# Patient Record
Sex: Female | Born: 1970 | Race: Black or African American | Hispanic: No | Marital: Married | Smoking: Current every day smoker
Health system: Southern US, Community
[De-identification: ages and names within clinical notes are randomized; demographics above are authoritative.]

## PROBLEM LIST (undated history)

## (undated) DIAGNOSIS — N61 Mastitis without abscess: Secondary | ICD-10-CM

## (undated) HISTORY — PX: TUBAL LIGATION: SHX77

---

## 2005-06-30 ENCOUNTER — Emergency Department (HOSPITAL_COMMUNITY): Admission: EM | Admit: 2005-06-30 | Discharge: 2005-06-30 | Payer: Self-pay | Admitting: Emergency Medicine

## 2006-03-05 ENCOUNTER — Emergency Department (HOSPITAL_COMMUNITY): Admission: EM | Admit: 2006-03-05 | Discharge: 2006-03-05 | Payer: Self-pay | Admitting: Emergency Medicine

## 2009-10-04 ENCOUNTER — Emergency Department (HOSPITAL_COMMUNITY): Admission: EM | Admit: 2009-10-04 | Discharge: 2009-10-04 | Payer: Self-pay | Admitting: Emergency Medicine

## 2009-12-26 ENCOUNTER — Emergency Department (HOSPITAL_COMMUNITY): Admission: EM | Admit: 2009-12-26 | Discharge: 2009-12-26 | Payer: Self-pay | Admitting: Emergency Medicine

## 2010-05-24 ENCOUNTER — Emergency Department (HOSPITAL_COMMUNITY)
Admission: EM | Admit: 2010-05-24 | Discharge: 2010-05-24 | Payer: Self-pay | Source: Home / Self Care | Admitting: Emergency Medicine

## 2010-11-20 ENCOUNTER — Emergency Department (HOSPITAL_COMMUNITY)
Admission: EM | Admit: 2010-11-20 | Discharge: 2010-11-20 | Disposition: A | Discharge status: Home / Self Care | Payer: Medicaid Other | Attending: Emergency Medicine | Admitting: Emergency Medicine

## 2010-11-20 ENCOUNTER — Emergency Department (HOSPITAL_COMMUNITY): Admission: EM | Admit: 2010-11-20 | Payer: Self-pay | Source: Home / Self Care

## 2010-11-20 DIAGNOSIS — N61 Mastitis without abscess: Secondary | ICD-10-CM | POA: Insufficient documentation

## 2010-11-20 DIAGNOSIS — N644 Mastodynia: Secondary | ICD-10-CM | POA: Insufficient documentation

## 2011-06-08 ENCOUNTER — Emergency Department (HOSPITAL_COMMUNITY)
Admission: EM | Admit: 2011-06-08 | Discharge: 2011-06-08 | Disposition: A | Discharge status: Home / Self Care | Payer: Medicaid Other | Attending: Emergency Medicine | Admitting: Emergency Medicine

## 2011-06-08 ENCOUNTER — Encounter (HOSPITAL_COMMUNITY): Payer: Self-pay | Admitting: *Deleted

## 2011-06-08 DIAGNOSIS — M545 Low back pain, unspecified: Secondary | ICD-10-CM | POA: Insufficient documentation

## 2011-06-08 DIAGNOSIS — R209 Unspecified disturbances of skin sensation: Secondary | ICD-10-CM | POA: Insufficient documentation

## 2011-06-08 DIAGNOSIS — M79609 Pain in unspecified limb: Secondary | ICD-10-CM | POA: Insufficient documentation

## 2011-06-08 DIAGNOSIS — G5790 Unspecified mononeuropathy of unspecified lower limb: Secondary | ICD-10-CM | POA: Insufficient documentation

## 2011-06-08 DIAGNOSIS — M792 Neuralgia and neuritis, unspecified: Secondary | ICD-10-CM

## 2011-06-08 MED ORDER — GABAPENTIN 100 MG PO CAPS: 100.0000 mg | ORAL_CAPSULE | Freq: Three times a day (TID) | ORAL | Status: DC

## 2011-06-08 NOTE — ED Notes (Signed)
Pt in c/o burning pain to left leg x3 months, denies injury, states pain is constant and is not effected by movement

## 2011-06-08 NOTE — ED Provider Notes (Signed)
History     CSN: [id number]  Arrival date & time 06/08/11  1819   First MD Initiated Contact with Patient 06/08/11 1829     7:06 PM HPI Patient reports for 3 months has had burning pain in her left anterior thigh. Reports mild numbness as well. States pain has been constant for 3 months. Denies discoloration. Denies any aggravating or alleviating factors. Denies back pain, or urinary symptoms. Denies known injury. Reports she's had mild lower back pain for years. States back pain does not radiate. Denies perineal numbness, saddle anesthesias, incontinence, abdominal pain, nausea, vomiting. Patient is a 41 y.o. female presenting with leg pain. The history is provided by the patient.  Leg Pain  Incident onset: 3 months. The injury mechanism was a direct blow. The pain is present in the left thigh. The quality of the pain is described as burning. The pain is mild. The pain has been constant since onset. Associated symptoms include numbness. Pertinent negatives include no inability to bear weight, no loss of motion, no muscle weakness, no loss of sensation and no tingling. The symptoms are aggravated by nothing. She has tried NSAIDs, acetaminophen, rest, elevation and heat for the symptoms. The treatment provided no relief.    History reviewed. No pertinent past medical history.  History reviewed. No pertinent past surgical history.  History reviewed. No pertinent family history.  History  Substance Use Topics  . Smoking status: Not on file  . Smokeless tobacco: Not on file  . Alcohol Use: Not on file    OB History    Grav Para Term Preterm Abortions TAB SAB Ect Mult Living                  Review of Systems  HENT: Negative for neck pain and neck stiffness.   Musculoskeletal: Positive for back pain. Negative for myalgias.       Leg pain  Skin: Negative for color change, pallor, rash and wound.  Neurological: Positive for numbness. Negative for tingling and weakness.  All other  systems reviewed and are negative.    Allergies  Review of patient's allergies indicates no known allergies.  Home Medications  No current outpatient prescriptions on file.  BP 129/85  Pulse 99  Temp(Src) 99.1 F (37.3 C) (Oral)  Resp 16  Ht 5\' 11"  (1.803 m)  Wt 220 lb (99.791 kg)  BMI 30.68 kg/m2  SpO2 100%  Physical Exam  Vitals reviewed. Constitutional: She is oriented to person, place, and time. Vital signs are normal. She appears well-developed and well-nourished.  HENT:  Head: Normocephalic and atraumatic.  Eyes: Conjunctivae are normal. Pupils are equal, round, and reactive to light.  Neck: Normal range of motion. Neck supple.  Cardiovascular: Normal rate, regular rhythm and normal heart sounds.  Exam reveals no friction rub.   No murmur heard. Pulmonary/Chest: Effort normal and breath sounds normal. She has no wheezes. She has no rhonchi. She has no rales. She exhibits no tenderness.  Musculoskeletal: Normal range of motion.       Lumbar back: Normal. She exhibits normal range of motion, no tenderness, no bony tenderness, no swelling, no edema, no pain, no spasm and normal pulse.       Left upper leg: Normal. She exhibits no tenderness, no bony tenderness, no swelling, no edema, no deformity and no laceration.       Legs:      Negative straight leg raise.  Neurological: She is alert and oriented to person, place, and  time. Coordination normal.  Skin: Skin is warm and dry. No rash noted. No erythema. No pallor.    ED Course  Procedures   MDM   Will discharge with Neurontin followup for [facility] physician. Patient agrees to plan and is ready for discharge  Thomasene Lot, Cordelia Poche 06/08/11 1610  Low suspicion for acute pathology: no cords or masses of thigh. No reproducible pain. No signs and symptoms of Cauda equina. Pulses, ROM and Strength normal. Advised patient to return for any worsening symptoms. But otherwise would require  further evaluation with either [facility] or ortho. Patient agrees with plan and is ready for d/c  Thomasene Lot, PA-C 06/08/11 1924

## 2011-06-09 NOTE — ED Provider Notes (Signed)
Medical screening examination/treatment/procedure(s) were performed by non-physician practitioner and as supervising physician I was immediately available for consultation/collaboration.   Laray Anger, DO 06/09/11 [phone]

## 2011-07-15 ENCOUNTER — Encounter (HOSPITAL_COMMUNITY): Payer: Self-pay | Admitting: *Deleted

## 2011-07-15 ENCOUNTER — Emergency Department (HOSPITAL_COMMUNITY): Payer: Medicaid Other

## 2011-07-15 ENCOUNTER — Emergency Department (HOSPITAL_COMMUNITY)
Admission: EM | Admit: 2011-07-15 | Discharge: 2011-07-15 | Disposition: A | Discharge status: Home / Self Care | Payer: Medicaid Other | Attending: Emergency Medicine | Admitting: Emergency Medicine

## 2011-07-15 DIAGNOSIS — F17200 Nicotine dependence, unspecified, uncomplicated: Secondary | ICD-10-CM | POA: Insufficient documentation

## 2011-07-15 DIAGNOSIS — S92919A Unspecified fracture of unspecified toe(s), initial encounter for closed fracture: Secondary | ICD-10-CM | POA: Insufficient documentation

## 2011-07-15 DIAGNOSIS — S92911A Unspecified fracture of right toe(s), initial encounter for closed fracture: Secondary | ICD-10-CM

## 2011-07-15 DIAGNOSIS — W2209XA Striking against other stationary object, initial encounter: Secondary | ICD-10-CM | POA: Insufficient documentation

## 2011-07-15 MED ORDER — HYDROCODONE-ACETAMINOPHEN 5-325 MG PO TABS
1.0000 | ORAL_TABLET | Freq: Once | ORAL | Status: AC
  Administered 2011-07-15: 1 via ORAL
  Filled 2011-07-15: qty 1

## 2011-07-15 MED ORDER — HYDROCODONE-ACETAMINOPHEN 5-325 MG PO TABS: 1.0000 | ORAL_TABLET | ORAL | Status: AC | PRN

## 2011-07-15 NOTE — ED Notes (Signed)
Ortho called 

## 2011-07-15 NOTE — ED Notes (Signed)
Pt states she was taking a shower Saturday night. Pt states the children where coming home and she ran out the shower and hit her toe. Pt right second toe noted to be swollen and redden. Pt has + pedal pulse

## 2011-07-15 NOTE — ED Provider Notes (Signed)
History     CSN: [id number]  Arrival date & time 07/15/11  1402   First MD Initiated Contact with Patient 07/15/11 1521      Chief Complaint  Patient presents with  . Toe Pain    (Consider location/radiation/quality/duration/timing/severity/associated sxs/prior treatment) Patient is a 41 y.o. female presenting with toe pain. The history is provided by the patient.  Toe Pain This is a new problem. The current episode started in the past 7 days. The problem occurs constantly. The problem has been gradually worsening. Pertinent negatives include no chills or fever. Associated symptoms comments: She hit her toe against a door 3 days ago causing injury. She hit it again today causing increased pain and swelling and presents for evaluation. No other injury.. The symptoms are aggravated by standing. She has tried nothing for the symptoms.    History reviewed. No pertinent past medical history.  No past surgical history on file.  No family history on file.  History  Substance Use Topics  . Smoking status: Current Everyday Smoker  . Smokeless tobacco: Not on file  . Alcohol Use: Yes    OB History    Grav Para Term Preterm Abortions TAB SAB Ect Mult Living                  Review of Systems  Constitutional: Negative for fever and chills.  HENT: Negative.   Respiratory: Negative.   Cardiovascular: Negative.   Gastrointestinal: Negative.   Musculoskeletal:       See HPI.  Skin: Negative.   Neurological: Negative.     Allergies  Review of patient's allergies indicates no known allergies.  Home Medications   Current Outpatient Rx  Name Route Sig Dispense Refill  . GABAPENTIN 100 MG PO CAPS Oral Take 1 capsule (100 mg total) by mouth 3 (three) times daily. 30 capsule 0    BP 143/92  Pulse 86  Temp(Src) 98.4 F (36.9 C) (Oral)  Resp 20  Ht 5\' 10"  (1.778 m)  Wt 225 lb (102.059 kg)  BMI 32.28 kg/m2  SpO2 97%  LMP 07/15/2011  Physical Exam  Constitutional: She is  oriented to person, place, and time. She appears well-developed and well-nourished.  Neck: Normal range of motion.  Pulmonary/Chest: Effort normal.  Musculoskeletal: She exhibits tenderness. She exhibits no edema.       Right foot:  2nd toe mildly swollen, ecchymotic. No bony deformity. Neurosensory without deficit. Nail intact. No subungual hematoma.  Neurological: She is alert and oriented to person, place, and time.  Skin: Skin is warm and dry.    ED Course  Procedures (including critical care time)  Labs Reviewed - No data to display No results found. Dg Toe 2nd Right  07/15/2011  *RADIOLOGY REPORT*  Clinical Data: Trauma.  Pain and swelling.  RIGTH SECOND TOE  Comparison: None.  Findings: There is an oblique fracture of the distal portion of the proximal phalanx of the right second toe.  This extends into the proximal interphalangeal joint.  The fracture is not significantly displaced or angulated.  IMPRESSION: Oblique fracture of the distal portion of the proximal phalanx right second toe with extension into the PIP joint as discussed above.  Original Report Authenticated By: Rolla Plate, M.D.    No diagnosis found. 1. Fracture 2nd right toe - proximal  MDM    Rodena Medin, PA-C 07/15/11 1641

## 2011-07-15 NOTE — Discharge Instructions (Signed)
FOLLOW UP WITH DR. Victorino Dike FOR FURTHER EVALUATION AND TREATMENT OF TOE FRACTURE. TAKE NORCO FOR PAIN. ICE TO REDUCE SWELLING. RETURN HERE AS NEEDED.  Toe Fracture Your caregiver has diagnosed you as having a fractured toe. A toe fracture is a break in the bone of a toe. "Buddy taping" is a way of splinting your broken toe, by taping the broken toe to the toe next to it. This "buddy taping" will keep the injured toe from moving beyond normal range of motion. Buddy taping also helps the toe heal in a more normal alignment. It may take 6 to 8 weeks for the toe injury to heal. HOME CARE INSTRUCTIONS   Leave your toes taped together for as long as directed by your caregiver or until you see a doctor for a follow-up examination. You can change the tape after bathing. Always use a small piece of gauze or cotton between the toes when taping them together. This will help the skin stay dry and prevent infection.   Apply ice to the injury for 15 to 20 minutes each hour while awake for the first 2 days. Put the ice in a plastic bag and place a towel between the bag of ice and your skin.   After the first 2 days, apply heat to the injured area. Use heat for the next 2 to 3 days. Place a heating pad on the foot or soak the foot in warm water as directed by your caregiver.   Keep your foot elevated as much as possible to lessen swelling.   Wear sturdy, supportive shoes. The shoes should not pinch the toes or fit tightly against the toes.   Your caregiver may prescribe a rigid shoe if your foot is very swollen.   Your may be given crutches if the pain is too great and it hurts too much to walk.   Only take over-the-counter or prescription medicines for pain, discomfort, or fever as directed by your caregiver.   If your caregiver has given you a follow-up appointment, it is very important to keep that appointment. Not keeping the appointment could result in a chronic or permanent injury, pain, and disability. If  there is any problem keeping the appointment, you must call back to this facility for assistance.  SEEK MEDICAL CARE IF:   You have increased pain or swelling, not relieved with medications.   The pain does not get better after 1 week.   Your injured toe is cold when the others are warm.  SEEK IMMEDIATE MEDICAL CARE IF:   The toe becomes cold, numb, or white.   The toe becomes hot (inflamed) and red.  Document Released: 04/12/2000 Document Revised: 04/04/2011 Document Reviewed: 11/30/2007 [facility] Patient Information 2012 [place], [place].

## 2011-07-16 NOTE — ED Provider Notes (Signed)
Medical screening examination/treatment/procedure(s) were performed by non-physician practitioner and as supervising physician I was immediately available for consultation/collaboration. Tzirel Leonor, MD, FACEP   Shiquita Collignon L Icesis Renn, MD 07/16/11 0053 

## 2012-09-09 ENCOUNTER — Encounter (HOSPITAL_COMMUNITY): Payer: Self-pay | Admitting: Emergency Medicine

## 2012-09-09 ENCOUNTER — Emergency Department (HOSPITAL_COMMUNITY)
Admission: EM | Admit: 2012-09-09 | Discharge: 2012-09-09 | Disposition: A | Discharge status: Home / Self Care | Payer: Medicaid Other | Attending: Emergency Medicine | Admitting: Emergency Medicine

## 2012-09-09 DIAGNOSIS — N611 Abscess of the breast and nipple: Secondary | ICD-10-CM

## 2012-09-09 DIAGNOSIS — N61 Mastitis without abscess: Secondary | ICD-10-CM

## 2012-09-09 DIAGNOSIS — F17200 Nicotine dependence, unspecified, uncomplicated: Secondary | ICD-10-CM | POA: Insufficient documentation

## 2012-09-09 MED ORDER — ONDANSETRON 8 MG PO TBDP
8.0000 mg | ORAL_TABLET | Freq: Once | ORAL | Status: AC
Start: 2012-09-09 — End: 2012-09-09
  Administered 2012-09-09: 8 mg via ORAL
  Filled 2012-09-09: qty 1

## 2012-09-09 MED ORDER — OXYCODONE-ACETAMINOPHEN 5-325 MG PO TABS
1.0000 | ORAL_TABLET | Freq: Once | ORAL | Status: DC
  Filled 2012-09-09: qty 1

## 2012-09-09 MED ORDER — AMOXICILLIN 500 MG PO CAPS
500.0000 mg | ORAL_CAPSULE | Freq: Once | ORAL | Status: AC
  Administered 2012-09-09: 500 mg via ORAL
  Filled 2012-09-09: qty 1

## 2012-09-09 MED ORDER — SULFAMETHOXAZOLE-TRIMETHOPRIM 800-160 MG PO TABS: 1.0000 | ORAL_TABLET | Freq: Two times a day (BID) | ORAL | Status: DC

## 2012-09-09 MED ORDER — AMOXICILLIN 500 MG PO CAPS: 500.0000 mg | ORAL_CAPSULE | Freq: Three times a day (TID) | ORAL | Status: DC

## 2012-09-09 MED ORDER — OXYCODONE-ACETAMINOPHEN 5-325 MG PO TABS: 1.0000 | ORAL_TABLET | Freq: Four times a day (QID) | ORAL | Status: DC | PRN

## 2012-09-09 MED ORDER — HYDROMORPHONE HCL PF 2 MG/ML IJ SOLN
2.0000 mg | Freq: Once | INTRAMUSCULAR | Status: AC
  Administered 2012-09-09: 2 mg via INTRAMUSCULAR
  Filled 2012-09-09: qty 1

## 2012-09-09 MED ORDER — SULFAMETHOXAZOLE-TMP DS 800-160 MG PO TABS
1.0000 | ORAL_TABLET | Freq: Once | ORAL | Status: AC
  Administered 2012-09-09: 1 via ORAL
  Filled 2012-09-09: qty 1

## 2012-09-09 MED ORDER — LIDOCAINE-EPINEPHRINE (PF) 1 %-1:200000 IJ SOLN
INTRAMUSCULAR | Status: AC
  Administered 2012-09-09: 30 mL
  Filled 2012-09-09: qty 10

## 2012-09-09 NOTE — Progress Notes (Signed)
Medicaid pt listed with [facility] on 2011 scanned medicaid card EPIC updated

## 2012-09-09 NOTE — ED Notes (Signed)
Pt states that her left breast is red, swollen, and painful x 3-4 days.  Pain 10/10.  Pt states that she had pus coming out of her nipple.

## 2012-09-09 NOTE — ED Provider Notes (Signed)
History     CSN: [id number]  Arrival date & time 09/09/12  0916   First MD Initiated Contact with Patient 09/09/12 1112      Chief Complaint  Patient presents with  . Breast Pain    (Consider location/radiation/quality/duration/timing/severity/associated sxs/prior treatment) HPI Comments: Patient with h/o mastitis twice in the past that resolved with oral antibiotics -- presents with three-day history of red, painful left breast. Today she noted dark pus coming from her nipple. She has not had this in the past. She denies fever, nausea, or vomiting. Onset of symptoms gradual. Course is gradually worsening. Nothing makes symptoms better/worse. No treatments prior to arrival.  The history is provided by the patient.    History reviewed. No pertinent past medical history.  History reviewed. No pertinent past surgical history.  History reviewed. No pertinent family history.  History  Substance Use Topics  . Smoking status: Current Every Day Smoker  . Smokeless tobacco: Not on file  . Alcohol Use: Yes    OB History   Grav Para Term Preterm Abortions TAB SAB Ect Mult Living                  Review of Systems  Constitutional: Negative for fever.  HENT: Negative for sore throat and rhinorrhea.   Eyes: Negative for redness.  Respiratory: Negative for cough.   Cardiovascular: Negative for chest pain.  Gastrointestinal: Negative for nausea, vomiting, abdominal pain and diarrhea.  Genitourinary: Negative for dysuria.  Musculoskeletal: Negative for myalgias.  Skin: Positive for color change.  Neurological: Negative for headaches.    Allergies  Review of patient's allergies indicates no known allergies.  Home Medications   Current Outpatient Rx  Name  Route  Sig  Dispense  Refill  . amoxicillin (AMOXIL) 500 MG capsule   Oral   Take 1 capsule (500 mg total) by mouth 3 (three) times daily.   21 capsule   0   . oxyCODONE-acetaminophen (PERCOCET/ROXICET) 5-325 MG per  tablet   Oral   Take 1-2 tablets by mouth every 6 (six) hours as needed for pain.   12 tablet   0   . sulfamethoxazole-trimethoprim (SEPTRA DS) 800-160 MG per tablet   Oral   Take 1 tablet by mouth every 12 (twelve) hours.   14 tablet   0     BP 151/95  Pulse 85  Temp(Src) 99 F (37.2 C) (Oral)  Resp 16  Ht 5' 10.5" (1.791 m)  Wt 225 lb (102.059 kg)  BMI 31.82 kg/m2  SpO2 100%  LMP 08/26/2012  Physical Exam  Nursing note and vitals reviewed. Constitutional: She appears well-developed and well-nourished.  HENT:  Head: Normocephalic and atraumatic.  Eyes: Conjunctivae are normal.  Neck: Normal range of motion. Neck supple.  Pulmonary/Chest: No respiratory distress. Left breast exhibits tenderness. Left breast exhibits no inverted nipple, no mass, no nipple discharge and no skin change.    Neurological: She is alert.  Skin: Skin is warm and dry.  Psychiatric: She has a normal mood and affect.    ED Course  Procedures (including critical care time)  Labs Reviewed  WOUND CULTURE   No results found.   1. Breast abscess   2. Mastitis     11:33 AM Patient seen and examined. Medications ordered. D/w Dr. Effie Shy. [place] used by myself. There is apparent 2cm x 1cm (depth) abscess superficial in firm area.    Vital signs reviewed and are as follows: Filed Vitals:   09/09/12 1122  BP: 151/95  Pulse:   Temp:   Resp:   BP 151/95  Pulse 85  Temp(Src) 99 F (37.2 C) (Oral)  Resp 16  Ht 5' 10.5" (1.791 m)  Wt 225 lb (102.059 kg)  BMI 31.82 kg/m2  SpO2 100%  LMP 08/26/2012  Two attempts made with return of pus on second attempt   INCISION AND DRAINAGE Performed by: Carolee Rota Consent: Verbal consent obtained. Risks and benefits: risks, benefits and alternatives were discussed Type: abscess  Body area: L breast  Anesthesia: local infiltration  Aspiration with 18 gauge needle was performed.   Local anesthetic: lidocaine 2% with  epinephrine  Anesthetic total: 5 ml  Drainage: purulent  Drainage amount: 0.5cc  Packing material: none  Patient tolerance: Patient tolerated the procedure well with no immediate complications.   The patient was urged to return to the Emergency Department urgently with worsening pain, swelling, expanding erythema especially if it streaks away from the affected area, fever, or if they have any other concerns.   The patient was urged to return to the Emergency Department or go to their PCP in 48 hours for wound recheck if the area is not significantly improved.  The patient verbalized understanding and stated agreement with this plan.    We discussed need for f/u with PCP/GYN due to small chance this is related to inflammatory process 2/2 breast CA.   Patient counseled on use of narcotic pain medications. Counseled not to combine these medications with others containing tylenol. Urged not to drink alcohol, drive, or perform any other activities that requires focus while taking these medications. The patient verbalizes understanding and agrees with the plan.     MDM  Patient with breast abscess. Aspiration performed after localization with [place]. D/c to home on antibiotics and pain medication. No systemic symptoms of illness.        Renne Crigler, PA-C 09/09/12 1722

## 2012-09-09 NOTE — ED Provider Notes (Signed)
Medical screening examination/treatment/procedure(s) were performed by non-physician practitioner and as supervising physician I was immediately available for consultation/collaboration.  Flint Melter, MD 09/09/12 [phone]

## 2012-09-11 LAB — WOUND CULTURE

## 2013-06-05 ENCOUNTER — Encounter (HOSPITAL_COMMUNITY): Payer: Self-pay | Admitting: Emergency Medicine

## 2013-06-05 ENCOUNTER — Emergency Department (HOSPITAL_COMMUNITY)
Admission: EM | Admit: 2013-06-05 | Discharge: 2013-06-05 | Disposition: A | Discharge status: Home / Self Care | Payer: Medicaid Other | Attending: Emergency Medicine | Admitting: Emergency Medicine

## 2013-06-05 DIAGNOSIS — Z791 Long term (current) use of non-steroidal anti-inflammatories (NSAID): Secondary | ICD-10-CM | POA: Insufficient documentation

## 2013-06-05 DIAGNOSIS — F17200 Nicotine dependence, unspecified, uncomplicated: Secondary | ICD-10-CM | POA: Insufficient documentation

## 2013-06-05 DIAGNOSIS — L0291 Cutaneous abscess, unspecified: Secondary | ICD-10-CM

## 2013-06-05 DIAGNOSIS — N61 Mastitis without abscess: Secondary | ICD-10-CM | POA: Insufficient documentation

## 2013-06-05 HISTORY — DX: Mastitis without abscess: N61.0

## 2013-06-05 MED ORDER — SULFAMETHOXAZOLE-TRIMETHOPRIM 800-160 MG PO TABS: 1.0000 | ORAL_TABLET | Freq: Two times a day (BID) | ORAL | Status: DC

## 2013-06-05 MED ORDER — HYDROCODONE-ACETAMINOPHEN 5-325 MG PO TABS: 1.0000 | ORAL_TABLET | Freq: Four times a day (QID) | ORAL | Status: DC | PRN

## 2013-06-05 MED ORDER — OXYCODONE-ACETAMINOPHEN 5-325 MG PO TABS
2.0000 | ORAL_TABLET | Freq: Once | ORAL | Status: AC
  Administered 2013-06-05: 2 via ORAL
  Filled 2013-06-05: qty 2

## 2013-06-05 NOTE — Discharge Instructions (Signed)
Do not drive, operate heavy machinery, drink alcohol, or take other tylenol containing products with Norco. Please take all of your antibiotics until finished!   You may develop abdominal discomfort or diarrhea from the antibiotic.  You may help offset this with probiotics which you can buy or get in yogurt. Do not eat  or take the probiotics until 2 hours after your antibiotic.   Abscess An abscess is an infected area that contains a collection of pus and debris.It can occur in almost any part of the body. An abscess is also known as a furuncle or boil. CAUSES  An abscess occurs when tissue gets infected. This can occur from blockage of oil or sweat glands, infection of hair follicles, or a minor injury to the skin. As the body tries to fight the infection, pus collects in the area and creates pressure under the skin. This pressure causes pain. People with weakened immune systems have difficulty fighting infections and get certain abscesses more often.  SYMPTOMS Usually an abscess develops on the skin and becomes a painful mass that is red, warm, and tender. If the abscess forms under the skin, you may feel a moveable soft area under the skin. Some abscesses break open (rupture) on their own, but most will continue to get worse without care. The infection can spread deeper into the body and eventually into the bloodstream, causing you to feel ill.  DIAGNOSIS  Your caregiver will take your medical history and perform a physical exam. A sample of fluid may also be taken from the abscess to determine what is causing your infection. TREATMENT  Your caregiver may prescribe antibiotic medicines to fight the infection. However, taking antibiotics alone usually does not cure an abscess. Your caregiver may need to make a small cut (incision) in the abscess to drain the pus. In some cases, gauze is packed into the abscess to reduce pain and to continue draining the area. HOME CARE INSTRUCTIONS   Only take  over-the-counter or prescription medicines for pain, discomfort, or fever as directed by your caregiver.  If you were prescribed antibiotics, take them as directed. Finish them even if you start to feel better.  If gauze is used, follow your caregiver's directions for changing the gauze.  To avoid spreading the infection:  Keep your draining abscess covered with a bandage.  Wash your hands well.  Do not share personal care items, towels, or whirlpools with others.  Avoid skin contact with others.  Keep your skin and clothes clean around the abscess.  Keep all follow-up appointments as directed by your caregiver. SEEK MEDICAL CARE IF:   You have increased pain, swelling, redness, fluid drainage, or bleeding.  You have muscle aches, chills, or a general ill feeling.  You have a fever. MAKE SURE YOU:   Understand these instructions.  Will watch your condition.  Will get help right away if you are not doing well or get worse. Document Released: 01/23/2005 Document Revised: 10/15/2011 Document Reviewed: 06/28/2011 [facility] Patient Information 2014 [place], [place].  Cellulitis Cellulitis is an infection of the skin and the tissue beneath it. The infected area is usually red and tender. Cellulitis occurs most often in the arms and lower legs.  CAUSES  Cellulitis is caused by bacteria that enter the skin through cracks or cuts in the skin. The most common types of bacteria that cause cellulitis are Staphylococcus and Streptococcus. SYMPTOMS   Redness and warmth.  Swelling.  Tenderness or pain.  Fever. DIAGNOSIS  Your  caregiver can usually determine what is wrong based on a physical exam. Blood tests may also be done. TREATMENT  Treatment usually involves taking an antibiotic medicine. HOME CARE INSTRUCTIONS   Take your antibiotics as directed. Finish them even if you start to feel better.  Keep the infected arm or leg elevated to reduce swelling.  Apply a warm cloth  to the affected area up to 4 times per day to relieve pain.  Only take over-the-counter or prescription medicines for pain, discomfort, or fever as directed by your caregiver.  Keep all follow-up appointments as directed by your caregiver. SEEK MEDICAL CARE IF:   You notice red streaks coming from the infected area.  Your red area gets larger or turns dark in color.  Your bone or joint underneath the infected area becomes painful after the skin has healed.  Your infection returns in the same area or another area.  You notice a swollen bump in the infected area.  You develop new symptoms. SEEK IMMEDIATE MEDICAL CARE IF:   You have a fever.  You feel very sleepy.  You develop vomiting or diarrhea.  You have a general ill feeling (malaise) with muscle aches and pains. MAKE SURE YOU:   Understand these instructions.  Will watch your condition.  Will get help right away if you are not doing well or get worse. Document Released: 01/23/2005 Document Revised: 10/15/2011 Document Reviewed: 07/01/2011 [facility] Patient Information 2014 [place], [place].

## 2013-06-05 NOTE — ED Notes (Signed)
Pt c/o lt breast pain since Monday. Pt has hx of mastitis.  Denies fevers.

## 2013-06-05 NOTE — ED Provider Notes (Signed)
CSN: [id number]     Arrival date & time 06/05/13  1759 History  This chart was scribed for non-physician practitioner, Arthor CaptainAbigail Karen Kinnard, PA-C,working with Nelia Shiobert L Beaton, MD by Karle PlumberJennifer Tensley, ED Scribe.  This patient was seen in room WTR6/WTR6 and the patient's care was started at 6:41 PM.  Chief Complaint  Patient presents with  . Breast Pain   The history is provided by the patient. No language interpreter was used.   HPI Comments:  Rebecca Keller is a 43 y.o. female with h/o mastitis,who presents to the Emergency Department complaining of severe, sharp, sticking left breast pain that started 6 days ago. She reports the left breast is warm to the touch, erythematous, and has discharge near the nipple that started today. She states that her last mastitis exacerbation was March of 2014. She states that every time she has been diagnosed with mastitis she has complete relief with antibiotics (unsure of what), therefore she has never followed up with a specialist. She denies nausea, vomiting, abdominal pain, fever, chills, or axillary swelling. She denies h/o breast cancer. She states she has never had a mammogram.   Past Medical History  Diagnosis Date  . Mastitis    No past surgical history on file. No family history on file. History  Substance Use Topics  . Smoking status: Current Every Day Smoker  . Smokeless tobacco: Not on file  . Alcohol Use: Yes   OB History   Grav Para Term Preterm Abortions TAB SAB Ect Mult Living                 Review of Systems  Constitutional: Negative for fever and chills.  Gastrointestinal: Negative for nausea, vomiting and abdominal pain.  Skin: Positive for color change (erythema, edema of left breast).  All other systems reviewed and are negative.    Allergies  Review of patient's allergies indicates no known allergies.  Home Medications   Current Outpatient Rx  Name  Route  Sig  Dispense  Refill  . naproxen (NAPROSYN) 375 MG tablet   Oral   Take 375 mg by mouth 2 (two) times daily with a meal.          Triage Vitals: BP 167/90  Pulse 112  Temp(Src) 98.6 F (37 C) (Oral)  Resp 16  SpO2 100%  LMP 06/03/2013 Physical Exam  Nursing note and vitals reviewed. Constitutional: She is oriented to person, place, and time. She appears well-developed and well-nourished.  HENT:  Head: Normocephalic and atraumatic.  Eyes: EOM are normal.  Neck: Normal range of motion.  Cardiovascular: Normal rate.   Pulmonary/Chest: Effort normal.  Musculoskeletal: Normal range of motion.  Neurological: She is alert and oriented to person, place, and time.  Skin: Skin is warm and dry. There is erythema.  4 cm area of induration of left breast with central fluctuance. Erythema spreading up half way of medial breast. Crusting of partially inverted nipple. No other skin changes noted.   Psychiatric: She has a normal mood and affect. Her behavior is normal.    ED Course  Procedures (including critical care time) DIAGNOSTIC STUDIES: Oxygen Saturation is 100% on RA, normal by my interpretation.   COORDINATION OF CARE: 6:46 PM- Will discuss pt with Dr. Radford PaxBeaton for appropriate course of treatment. Pt verbalizes understanding and agrees to plan.  6:57 PM- Pt refuses I & D of abscess to left breast at this time. Discussed risks and pt states she only wants antibiotics and pain medication. Advised pt  to follow up with PCP or [facility] for worsening symptoms.   Medications - No data to display  Labs Review Labs Reviewed - No data to display Imaging Review No results found.  EKG Interpretation   None       MDM   1. Abscess   2. Mastitis    Patient refused I&D or aspiration at this time. She is advised to follow closely with her OB/GYN. Discussed immediate return precautions. The patient expresses understanding and agrees with POC. She will take abx/pain meds. Seen in shared visit with Dr. Radford Pax.  I personally performed the services  described in this documentation, which was scribed in my presence. The recorded information has been reviewed and is accurate.    Arthor Captain, PA-C 06/07/13 2024

## 2013-06-05 NOTE — ED Notes (Signed)
Pt has a ride home.  

## 2013-06-05 NOTE — ED Notes (Signed)
Pt ambulatory to exam room with steady gait. Pt changed into gown.

## 2013-06-11 NOTE — ED Provider Notes (Signed)
Medical screening examination/treatment/procedure(s) were performed by non-physician practitioner and as supervising physician I was immediately available for consultation/collaboration.    Rand Etchison L Dania Marsan, MD 06/11/13 0713 

## 2014-03-18 ENCOUNTER — Other Ambulatory Visit: Payer: Self-pay | Admitting: Emergency Medicine

## 2014-03-18 ENCOUNTER — Encounter (HOSPITAL_COMMUNITY): Payer: Self-pay | Admitting: Emergency Medicine

## 2014-03-18 ENCOUNTER — Emergency Department (HOSPITAL_COMMUNITY)
Admission: EM | Admit: 2014-03-18 | Discharge: 2014-03-18 | Disposition: A | Discharge status: Home / Self Care | Payer: Medicaid Other | Attending: Emergency Medicine | Admitting: Emergency Medicine

## 2014-03-18 ENCOUNTER — Ambulatory Visit
Admission: RE | Admit: 2014-03-18 | Discharge: 2014-03-18 | Disposition: A | Discharge status: Home / Self Care | Payer: Medicaid Other | Source: Ambulatory Visit | Attending: Emergency Medicine | Admitting: Emergency Medicine

## 2014-03-18 ENCOUNTER — Other Ambulatory Visit: Payer: Medicaid Other

## 2014-03-18 DIAGNOSIS — Z720 Tobacco use: Secondary | ICD-10-CM | POA: Insufficient documentation

## 2014-03-18 DIAGNOSIS — N6320 Unspecified lump in the left breast, unspecified quadrant: Secondary | ICD-10-CM

## 2014-03-18 DIAGNOSIS — N611 Abscess of the breast and nipple: Secondary | ICD-10-CM

## 2014-03-18 DIAGNOSIS — Z792 Long term (current) use of antibiotics: Secondary | ICD-10-CM | POA: Insufficient documentation

## 2014-03-18 DIAGNOSIS — N61 Inflammatory disorders of breast: Secondary | ICD-10-CM | POA: Insufficient documentation

## 2014-03-18 LAB — I-STAT CHEM 8, ED
BUN: 11 mg/dL (ref 6–23)
CHLORIDE: 105 meq/L (ref 96–112)
CREATININE: 0.8 mg/dL (ref 0.50–1.10)
Calcium, Ion: 1.38 mmol/L — ABNORMAL HIGH (ref 1.12–1.23)
Glucose, Bld: 81 mg/dL (ref 70–99)
HCT: 39 % (ref 36.0–46.0)
HEMOGLOBIN: 13.3 g/dL (ref 12.0–15.0)
POTASSIUM: 4.1 meq/L (ref 3.7–5.3)
SODIUM: 137 meq/L (ref 137–147)
TCO2: 23 mmol/L (ref 0–100)

## 2014-03-18 LAB — CBC WITH DIFFERENTIAL/PLATELET
Basophils Absolute: 0 10*3/uL (ref 0.0–0.1)
Basophils Relative: 0 % (ref 0–1)
EOS ABS: 0.1 10*3/uL (ref 0.0–0.7)
Eosinophils Relative: 1 % (ref 0–5)
HCT: 34.1 % — ABNORMAL LOW (ref 36.0–46.0)
Hemoglobin: 11.3 g/dL — ABNORMAL LOW (ref 12.0–15.0)
LYMPHS ABS: 2.5 10*3/uL (ref 0.7–4.0)
LYMPHS PCT: 24 % (ref 12–46)
MCH: 29.9 pg (ref 26.0–34.0)
MCHC: 33.1 g/dL (ref 30.0–36.0)
MCV: 90.2 fL (ref 78.0–100.0)
Monocytes Absolute: 0.7 10*3/uL (ref 0.1–1.0)
Monocytes Relative: 7 % (ref 3–12)
NEUTROS ABS: 6.8 10*3/uL (ref 1.7–7.7)
NEUTROS PCT: 67 % (ref 43–77)
PLATELETS: 170 10*3/uL (ref 150–400)
RBC: 3.78 MIL/uL — AB (ref 3.87–5.11)
RDW: 15.7 % — ABNORMAL HIGH (ref 11.5–15.5)
WBC: 10.1 10*3/uL (ref 4.0–10.5)

## 2014-03-18 MED ORDER — HYDROCODONE-ACETAMINOPHEN 5-325 MG PO TABS: 1.0000 | ORAL_TABLET | ORAL | Status: AC | PRN

## 2014-03-18 MED ORDER — HYDROMORPHONE HCL 1 MG/ML IJ SOLN
1.0000 mg | Freq: Once | INTRAMUSCULAR | Status: AC
  Administered 2014-03-18: 1 mg via INTRAVENOUS
  Filled 2014-03-18: qty 1

## 2014-03-18 MED ORDER — ONDANSETRON HCL 4 MG/2ML IJ SOLN
4.0000 mg | Freq: Once | INTRAMUSCULAR | Status: AC
  Administered 2014-03-18: 4 mg via INTRAVENOUS
  Filled 2014-03-18: qty 2

## 2014-03-18 MED ORDER — SULFAMETHOXAZOLE-TRIMETHOPRIM 800-160 MG PO TABS
1.0000 | ORAL_TABLET | Freq: Once | ORAL | Status: AC
  Administered 2014-03-18: 1 via ORAL
  Filled 2014-03-18: qty 1

## 2014-03-18 MED ORDER — SODIUM CHLORIDE 0.9 % IV SOLN
Freq: Once | INTRAVENOUS | Status: AC
  Administered 2014-03-18: 03:00:00 via INTRAVENOUS

## 2014-03-18 MED ORDER — SULFAMETHOXAZOLE-TRIMETHOPRIM 800-160 MG PO TABS: 1.0000 | ORAL_TABLET | Freq: Two times a day (BID) | ORAL | Status: AC

## 2014-03-18 NOTE — ED Provider Notes (Signed)
CSN: [id number]     Arrival date & time 03/18/14  0101 History   First MD Initiated Contact with Patient 03/18/14 [id number]     Chief Complaint  Patient presents with  . Breast Pain     (Consider location/radiation/quality/duration/timing/severity/associated sxs/prior Treatment) HPI Comments: Patient with recurrent L breast abscess  Now with 2 days of redness pain and swelling on L breast around aerola with tender area L axilla and L neck  Denies fever, numbness tingling   The history is provided by the patient.    Past Medical History  Diagnosis Date  . Mastitis    Past Surgical History  Procedure Laterality Date  . Tubal ligation     Family History  Problem Relation Age of Onset  . Hypertension Other   . Diabetes Other   . Hyperlipidemia Other    History  Substance Use Topics  . Smoking status: Current Every Day Smoker  . Smokeless tobacco: Not on file  . Alcohol Use: Yes     Comment: occ   OB History    No data available     Review of Systems  Constitutional: Negative for fever.  Skin: Positive for wound.  Neurological: Negative for numbness.  All other systems reviewed and are negative.     Allergies  Review of patient's allergies indicates no known allergies.  Home Medications   Prior to Admission medications   Medication Sig Start Date End Date Taking? Authorizing Provider  HYDROcodone-acetaminophen (NORCO/VICODIN) 5-325 MG per tablet Take 1 tablet by mouth every 4 (four) hours as needed. 03/18/14   Arman FilterGail K Taylee Gunnells, NP  sulfamethoxazole-trimethoprim (BACTRIM DS,SEPTRA DS) 800-160 MG per tablet Take 1 tablet by mouth 2 (two) times daily. 03/18/14   Arman FilterGail K Robert Sperl, NP   BP 141/84 mmHg  Pulse 76  Temp(Src) 98.2 F (36.8 C) (Oral)  Resp 20  Ht 5\' 10"  (1.778 m)  Wt 197 lb (89.359 kg)  BMI 28.27 kg/m2  SpO2 96%  LMP 03/13/2014 Physical Exam  Constitutional: She appears well-developed and well-nourished. She appears distressed.  HENT:  Head:  Normocephalic.  Eyes: Pupils are equal, round, and reactive to light.  Neck: Normal range of motion.    Cardiovascular: Normal rate and regular rhythm.   Pulmonary/Chest: Effort normal and breath sounds normal. She exhibits tenderness.    Abdominal: Soft.  Musculoskeletal: Normal range of motion.  Lymphadenopathy:    She has cervical adenopathy.  Neurological: She is alert.  Skin: There is erythema.  Nursing note and vitals reviewed.   ED Course  Procedures (including critical care time) Labs Review Labs Reviewed  CBC WITH DIFFERENTIAL - Abnormal; Notable for the following:    RBC 3.78 (*)    Hemoglobin 11.3 (*)    HCT 34.1 (*)    RDW 15.7 (*)    All other components within normal limits  I-STAT CHEM 8, ED - Abnormal; Notable for the following:    Calcium, Ion 1.38 (*)    All other components within normal limits    Imaging Review No results found.   EKG Interpretation None     Due to area of abscess patient will be sent to [facility] for [facility]D MDM   Final diagnoses:  Breast abscess         Arman FilterGail K Zofia Peckinpaugh, NP 03/18/14 2000  Arman FilterGail K Aleksandr Pellow, NP 03/18/14 2001  Olivia Mackielga M Otter, MD 03/19/14 1149

## 2014-03-18 NOTE — ED Notes (Signed)
Pt states she is having pain and swelling of the left breast  Pt states it hurts under her left arm and up into her neck on the left side  Pt states it started a couple of days ago

## 2014-03-18 NOTE — ED Notes (Signed)
Pt's family member at bedside upset because pt has not been seen by a doctor yet. Explained to pt's family member that the doctor or PA should be in shortly to assess the patient but that they are tied up at this moment. Pt's family member reports that he is going to go call an ambulance to pick the patient up and bring her back in so maybe she will get seen faster. Explained to pt that that will not help the patient get seen any faster. Pt agreeable with plan at this time, family member remains upset. Charge RN aware.

## 2014-03-18 NOTE — ED Notes (Signed)
PA at bedside.

## 2014-03-18 NOTE — Discharge Instructions (Signed)
Due to the location of your abscess is imperative that you be seen and treated at the breast center.  You have been given information to call.  Please call first thing in the morning after 8:00 telling the referred through the emergency department for an abscess that needs to be drained acutely U been given a prescription for Vicodin for pain control as well as Septra as an antibiotic.  You can apply warm compresses to the area.  This may help with some of the discomfort

## 2018-06-20 ENCOUNTER — Emergency Department (HOSPITAL_COMMUNITY): Payer: PRIVATE HEALTH INSURANCE

## 2018-06-20 ENCOUNTER — Encounter (HOSPITAL_COMMUNITY): Payer: Self-pay | Admitting: *Deleted

## 2018-06-20 ENCOUNTER — Emergency Department (HOSPITAL_COMMUNITY)
Admission: EM | Admit: 2018-06-20 | Discharge: 2018-06-20 | Disposition: A | Discharge status: Home / Self Care | Payer: PRIVATE HEALTH INSURANCE | Attending: Emergency Medicine | Admitting: Emergency Medicine

## 2018-06-20 DIAGNOSIS — F17200 Nicotine dependence, unspecified, uncomplicated: Secondary | ICD-10-CM | POA: Insufficient documentation

## 2018-06-20 DIAGNOSIS — M25521 Pain in right elbow: Secondary | ICD-10-CM | POA: Diagnosis not present

## 2018-06-20 LAB — BASIC METABOLIC PANEL
ANION GAP: 7 (ref 5–15)
BUN: 9 mg/dL (ref 6–20)
CO2: 21 mmol/L — ABNORMAL LOW (ref 22–32)
Calcium: 10.6 mg/dL — ABNORMAL HIGH (ref 8.9–10.3)
Chloride: 106 mmol/L (ref 98–111)
Creatinine, Ser: 0.71 mg/dL (ref 0.44–1.00)
GFR calc Af Amer: >60 mL/min (ref >60)
GFR calc non Af Amer: >60 mL/min (ref >60)
Glucose, Bld: 103 mg/dL — ABNORMAL HIGH (ref 70–99)
Potassium: 4 mmol/L (ref 3.5–5.1)
Sodium: 134 mmol/L — ABNORMAL LOW (ref 135–145)

## 2018-06-20 LAB — CBC WITH DIFFERENTIAL/PLATELET
Abs Immature Granulocytes: 0.06 10*3/uL (ref 0.00–0.07)
BASOS PCT: 0 %
Basophils Absolute: 0 10*3/uL (ref 0.0–0.1)
EOS ABS: 0.1 10*3/uL (ref 0.0–0.5)
Eosinophils Relative: 1 %
HCT: 30.8 % — ABNORMAL LOW (ref 36.0–46.0)
Hemoglobin: 9.4 g/dL — ABNORMAL LOW (ref 12.0–15.0)
Immature Granulocytes: 1 %
Lymphocytes Relative: 16 %
Lymphs Abs: 1.8 10*3/uL (ref 0.7–4.0)
MCH: 26 pg (ref 26.0–34.0)
MCHC: 30.5 g/dL (ref 30.0–36.0)
MCV: 85.1 fL (ref 80.0–100.0)
Monocytes Absolute: 0.8 10*3/uL (ref 0.1–1.0)
Monocytes Relative: 7 %
Neutro Abs: 8.9 10*3/uL — ABNORMAL HIGH (ref 1.7–7.7)
Neutrophils Relative %: 75 %
PLATELETS: 196 10*3/uL (ref 150–400)
RBC: 3.62 MIL/uL — ABNORMAL LOW (ref 3.87–5.11)
RDW: 18 % — ABNORMAL HIGH (ref 11.5–15.5)
WBC: 11.7 10*3/uL — ABNORMAL HIGH (ref 4.0–10.5)
nRBC: 0 % (ref 0.0–0.2)

## 2018-06-20 LAB — I-STAT BETA HCG BLOOD, ED (MC, WL, AP ONLY): I-stat hCG, quantitative: <5 m[IU]/mL (ref <5)

## 2018-06-20 MED ORDER — NAPROXEN 500 MG PO TABS: 500.0000 mg | ORAL_TABLET | Freq: Two times a day (BID) | ORAL | 0 refills | Status: AC

## 2018-06-20 MED ORDER — KETOROLAC TROMETHAMINE 30 MG/ML IJ SOLN
30.0000 mg | Freq: Once | INTRAMUSCULAR | Status: AC
  Administered 2018-06-20: 30 mg via INTRAMUSCULAR
  Filled 2018-06-20: qty 1

## 2018-06-20 NOTE — ED Triage Notes (Signed)
Per EMS, pt from home complains of right elbow pain x 24 hours. Pain was worse since this morning, pt was unable to bend right elbow this morning. Pt has good PMS to right upper extremity. Pt has swelling to elbow. Pt denies injury.  BP 148/90 HR 84 CBG 120

## 2018-06-20 NOTE — ED Provider Notes (Signed)
[facility]-EMERGENCY DEPT Provider Note   CSN: [id number] Arrival date & time: 06/20/18  4010    History   Chief Complaint Chief Complaint  Patient presents with  . Elbow Pain    right    HPI Rebecca Keller is a 48 y.o. female with a PMH of mastitis presenting with constant right elbow pain radiating to right wrist onset yesterday. Patient reports edema and decreased range of motion. Patient describes pain as sharp and states it is worse with movement. Patient reports symptoms have gradually worsened. Patient reports taking ibuprofen with partial relief. Patient denies injury, fever, chills, nausea, vomiting, or abdominal pain. Patient reports weakness due to pain and intermittent paresthesias. Patient reports tobacco, occasional alcohol, and marijuana use. Patient denies IVDU. Patient reports aunt had bone cancer at 62. Patient denies any other personal or family history of orthopedic or rheumatologic problems. LMP on 06/06/2018.     HPI  Past Medical History:  Diagnosis Date  . Mastitis     There are no active problems to display for this patient.   Past Surgical History:  Procedure Laterality Date  . TUBAL LIGATION       OB History   No obstetric history on file.      Home Medications    Prior to Admission medications   Medication Sig Start Date End Date Taking? Authorizing Provider  ibuprofen (ADVIL,MOTRIN) 200 MG tablet Take 400 mg by mouth every 6 (six) hours as needed for mild pain.   Yes [provider]  polyvinyl alcohol (LIQUIFILM TEARS) 1.4 % ophthalmic solution Place 1 drop into both eyes as needed for dry eyes.   Yes [provider]  HYDROcodone-acetaminophen (NORCO/VICODIN) 5-325 MG per tablet Take 1 tablet by mouth every 4 (four) hours as needed. Patient not taking: Reported on 06/20/2018 03/18/14   Earley Favor, NP  naproxen (NAPROSYN) 500 MG tablet Take 1 tablet (500 mg total) by mouth 2 (two) times daily. 06/20/18    Carlyle Basques P, PA-C  sulfamethoxazole-trimethoprim (BACTRIM DS,SEPTRA DS) 800-160 MG per tablet Take 1 tablet by mouth 2 (two) times daily. Patient not taking: Reported on 06/20/2018 03/18/14   Earley Favor, NP  gabapentin (NEURONTIN) 100 MG capsule Take 1 capsule (100 mg total) by mouth 3 (three) times daily. 06/08/11 07/15/11  Thomasene Lot, PA-C    Family History Family History  Problem Relation Age of Onset  . Hypertension Other   . Diabetes Other   . Hyperlipidemia Other     Social History Social History   Tobacco Use  . Smoking status: Current Every Day Smoker  . Smokeless tobacco: Never Used  Substance Use Topics  . Alcohol use: Yes    Comment: occ  . Drug use: No     Allergies   Patient has no known allergies.   Review of Systems Review of Systems  Constitutional: Negative for chills, diaphoresis and fever.  HENT: Negative for congestion and rhinorrhea.   Respiratory: Negative for cough and shortness of breath.   Cardiovascular: Negative for chest pain.  Gastrointestinal: Negative for abdominal pain, diarrhea, nausea and vomiting.  Endocrine: Negative for cold intolerance and heat intolerance.  Genitourinary: Negative for dysuria.  Musculoskeletal: Positive for arthralgias and joint swelling. Negative for back pain, gait problem, neck pain and neck stiffness.  Skin: Negative for rash.  Allergic/Immunologic: Negative for immunocompromised state.  Neurological: Positive for weakness. Negative for numbness.  Hematological: Negative for adenopathy.     Physical Exam Updated Vital Signs BP Marland Kitchen)  144/88 (BP Location: Right Arm)   Pulse 88   Temp 98.5 F (36.9 C) (Oral)   Resp 18   LMP 06/06/2018   SpO2 100%   Physical Exam Vitals signs and nursing note reviewed.  Constitutional:      General: She is not in acute distress.    Appearance: She is well-developed. She is not diaphoretic.  HENT:     Head: Normocephalic and atraumatic.  Cardiovascular:      Rate and Rhythm: Normal rate and regular rhythm.     Heart sounds: Normal heart sounds. No murmur. No friction rub. No gallop.   Pulmonary:     Effort: Pulmonary effort is normal. No respiratory distress.     Breath sounds: Normal breath sounds. No wheezing or rales.  Abdominal:     Palpations: Abdomen is soft.     Tenderness: There is no abdominal tenderness.  Musculoskeletal:     Right shoulder: Normal. She exhibits normal range of motion, no tenderness and no bony tenderness.     Right elbow: She exhibits decreased range of motion, swelling and effusion. Tenderness (Diffuse tenderness over right elbow noted.) found.     Right wrist: She exhibits decreased range of motion, tenderness and bony tenderness.     Comments: Edema noted on right elbow. Decreased ROM of elbow and wrist due to pain. 4/5 strength due to pain. Sensation intact. 2+ radial pulses.   Skin:    General: Skin is warm.     Findings: No erythema or rash.  Neurological:     Mental Status: She is alert and oriented to person, place, and time.      ED Treatments / Results  Labs (all labs ordered are listed, but only abnormal results are displayed) Labs Reviewed  BASIC METABOLIC PANEL - Abnormal; Notable for the following components:      Result Value   Sodium 134 (*)    CO2 21 (*)    Glucose, Bld 103 (*)    Calcium 10.6 (*)    All other components within normal limits  CBC WITH DIFFERENTIAL/PLATELET - Abnormal; Notable for the following components:   WBC 11.7 (*)    RBC 3.62 (*)    Hemoglobin 9.4 (*)    HCT 30.8 (*)    RDW 18.0 (*)    Neutro Abs 8.9 (*)    All other components within normal limits  I-STAT BETA HCG BLOOD, ED ([facility], [facility], [facility] ONLY)   EKG None  Radiology Dg Elbow Complete Right  Result Date: 06/20/2018 CLINICAL DATA:  Right elbow pain without trauma. EXAM: RIGHT ELBOW - COMPLETE 3+ VIEW COMPARISON:  None. FINDINGS: There is no evidence of fracture, or dislocation. There is however a large right  elbow joint effusion. There is no evidence of arthropathy or other focal bone abnormality. Soft tissues are unremarkable. IMPRESSION: 1. No acute fracture or dislocation identified about the right elbow. 2. Large right elbow joint effusion. If further imaging evaluation is found clinically appropriate, MRI of the elbow with contrast may be considered. Electronically Signed   By: Ted Mcalpine M.D.   On: 06/20/2018 11:14   Dg Wrist Complete Right  Result Date: 06/20/2018 CLINICAL DATA:  Pain without trauma. EXAM: RIGHT WRIST - COMPLETE 3+ VIEW COMPARISON:  None. FINDINGS: There is no evidence of fracture or dislocation. There is no evidence of arthropathy or other focal bone abnormality. Soft tissues are unremarkable. IMPRESSION: Negative. Electronically Signed   By: Ted Mcalpine M.D.   On: 06/20/2018  11:15    Procedures Procedures (including critical care time)  Medications Ordered in ED Medications  ketorolac (TORADOL) 30 MG/ML injection 30 mg (30 mg Intramuscular Given 06/20/18 0950)    Initial Impression / Assessment and Plan / ED Course  I have reviewed the triage vital signs and the nursing notes.  Pertinent labs & imaging results that were available during my care of the patient were reviewed by me and considered in my medical decision making (see chart for details).  Clinical Course as of Jun 20 1128  Sat Jun 20, 2018  1120 No acute fracture or dislocation identified about the right elbow or right wrist. Large right elbow joint effusion.     DG Elbow Complete Right [AH]    Clinical Course User Index [AH] Leretha Dykes, PA-C      Patient X-Ray negative for obvious fracture or dislocation. Pain managed in ED. Suspect symptoms are likely due to olecranon bursitis. Patient is afebrile and stable. Patient given sling while in ED. Conservative therapy recommended and discussed. Return precautions discussed with patient. Patient will be dc home & is agreeable with above  plan.  Final Clinical Impressions(s) / ED Diagnoses   Final diagnoses:  Right elbow pain    ED Discharge Orders         Ordered    naproxen (NAPROSYN) 500 MG tablet  2 times daily     06/20/18 1129           Leretha Dykes, [place] 06/20/18 1131    Azalia Bilis, MD 06/20/18 1701

## 2018-06-20 NOTE — Discharge Instructions (Addendum)
You have been seen today for right elbow pain. Please read and follow all provided instructions.   1. Medications: naproxen for pain, usual home medications 2. Treatment: rest, drink plenty of fluids 3. Follow Up: Please follow up with your primary doctor in 5 days for discussion of your diagnoses and further evaluation after today's visit; if you do not have a primary care doctor use the resource guide provided to find one; Please return to the ER for any new or worsening symptoms. Please obtain all of your results from medical records or have your doctors office obtain the results - share them with your doctor - you should be seen at your doctors office. Call today to arrange your follow up.   Take medications as prescribed. Please review all of the medicines and only take them if you do not have an allergy to them. Return to the emergency room for worsening condition or new concerning symptoms. Follow up with your regular doctor. If you don't have a regular doctor use one of the numbers below to establish a primary care doctor.  Please be aware that if you are taking birth control pills, taking other prescriptions, ESPECIALLY ANTIBIOTICS may make the birth control ineffective - if this is the case, either do not engage in sexual activity or use alternative methods of birth control such as condoms until you have finished the medicine and your family doctor says it is OK to restart them. If you are on a blood thinner such as COUMADIN, be aware that any other medicine that you take may cause the coumadin to either work too much, or not enough - you should have your coumadin level rechecked in next 7 days if this is the case.  ?  It is also a possibility that you have an allergic reaction to any of the medicines that you have been prescribed - Everybody reacts differently to medications and while MOST people have no trouble with most medicines, you may have a reaction such as nausea, vomiting, rash,  swelling, shortness of breath. If this is the case, please stop taking the medicine immediately and contact your physician.  ?  You should return to the ER if you develop severe or worsening symptoms.   Emergency Department Resource Guide 1) Find a Doctor and Pay Out of Pocket Although you won't have to find out who is covered by your insurance plan, it is a good idea to ask around and get recommendations. You will then need to call the office and see if the doctor you have chosen will accept you as a new patient and what types of options they offer for patients who are self-pay. Some doctors offer discounts or will set up payment plans for their patients who do not have insurance, but you will need to ask so you aren't surprised when you get to your appointment.  2) Contact Your Local Health Department Not all health departments have doctors that can see patients for sick visits, but many do, so it is worth a call to see if yours does. If you don't know where your local health department is, you can check in your phone book. The CDC also has a tool to help you locate your state's health department, and many state websites also have listings of all of their local health departments.  3) Find a Walk-in Clinic If your illness is not likely to be very severe or complicated, you may want to try a walk in clinic. These are  popping up all over the country in pharmacies, drugstores, and shopping centers. They're usually staffed by nurse practitioners or physician assistants that have been trained to treat common illnesses and complaints. They're usually fairly quick and inexpensive. However, if you have serious medical issues or chronic medical problems, these are probably not your best option.  No Primary Care Doctor: Call Health Connect at  [phone] - they can help you locate a primary care doctor that  accepts your insurance, provides certain services, etc. Physician Referral [phone]  Emergency Department Resource Guide 1) Find a Doctor and Pay Out of Pocket Although you won't have to find out who is covered by your insurance plan, it is a good idea to ask around and get recommendations. You will then need to call the office and see if the doctor you have chosen will accept you as a new patient and what types of options they offer for patients who are self-pay. Some doctors offer discounts or will set up payment plans for their patients who do not have insurance, but you will need to ask so you aren't surprised when you get to your appointment.  2) Contact Your Local Health Department Not all health departments have doctors that can see patients for sick visits, but many do, so it is worth a call to see if yours does. If you don't know where your local health department is, you can check in your phone book. The CDC also has a tool to help you locate your state's health department, and many state websites also have listings of all of their local health departments.  3) Find a [facility] If your illness is not likely to be very severe or complicated, you may want to try a walk in clinic. These are popping up all over the country in pharmacies, drugstores, and shopping centers. They're usually staffed by nurse practitioners or physician assistants that have been trained to treat common illnesses and complaints. They're usually fairly quick and inexpensive. However, if you have serious medical issues or chronic medical problems, these are probably not your best option.  No Primary Care Doctor: Call Health Connect at  [phone] - they can help you locate a primary care doctor that  accepts your insurance, provides certain services, etc. Physician Referral Service- [phone]  Chronic Pain Problems: Organization         Address  Phone   Notes  [facility]  [phone] Patients need to be referred by their primary care doctor.    Medication Assistance: Organization         Address  Phone   Notes  [facility] Medication [facility] [address] [place], [place] [address] [phone] --Must be a resident of [facility] -- Must have NO insurance coverage whatsoever (no Medicaid/ Medicare, etc.) -- The pt. MUST have a primary care doctor that directs their care regularly and follows them in the community   [facility]  [phone]   [employer]  [phone]    Agencies that provide inexpensive medical care: Organization         Address  Phone   Notes  [place]  [phone]   [facility]    [phone]   [facility], [place] [address] [phone]   [place] [address] [place] [phone]   [facility]    [phone])  [facility]    [phone]   [address], [place] Phone:  [phone], Fax:  [phone] Hours of Operation:  9 am - 6 pm, M-F.  Also accepts Medicaid/Medicare and self-pay.  [facility] for [place] [address], [address], [place] Phone: [phone], Fax: [phone]. Hours of Operation:  8:30 am - 5:30 pm, M-F.  Also accepts Medicaid and self-pay.  [facility] [address] [place] Phone: [phone]   [place], [place], [place] [phone], Ext. [phone] Mondays & Thursdays: 7-9 AM.  First 15 patients are seen on a first come, first serve basis.    [place] Providers:  Organization         Address  Phone   Notes  [facility] [address], [place] [phone] Also accepts self-pay patients.  [facility] [address], [place]  [phone]   [place], [address], [place] [phone]   [facility] [address], [place] [phone]   Lucianne Lei [address], [place]   [phone] Only accepts [place] patients after they have their name applied to their card.   Self-Pay (no insurance) in [facility]:  Organization         Address  Phone   Notes  Sickle Cell Patients, [facility] [place] [phone]   [facility] [place] [phone]   [facility] [place]  [place], [place], [place] [phone]   [facility]/Dr. Osei-Bonsu  [address], [place] or [address], [place] [phone] Phone number for both [place] and [place] locations is the same.  [facility] and [facility] [address] [place] [phone]   [facility] [address] [place] or [address] [phone]   [facility] [address], [place] [phone], phone; [phone], fax Sees patients 1st and 3rd Saturday of every month.  Must not qualify for public or private insurance (i.e. Medicaid, Medicare, [employer], Veterans' Benefits)  Household income should be no more than 200% of the poverty level The clinic cannot treat you if you are pregnant or think you are pregnant  Sexually transmitted diseases are not treated at the clinic.

## 2018-06-20 NOTE — ED Notes (Signed)
Bed: WA01 Expected date:  Expected time:  Means of arrival:  Comments: 48 yr old, Elbow Swelling EMS

## 2020-05-27 IMAGING — CR DG WRIST COMPLETE 3+V*R*
3 series · 3 of 3 positions shown · non-contrast
Comparison: None.

CLINICAL DATA: Pain without trauma.

EXAM:
RIGHT WRIST - COMPLETE 3+ VIEW

[x wrist pa right]
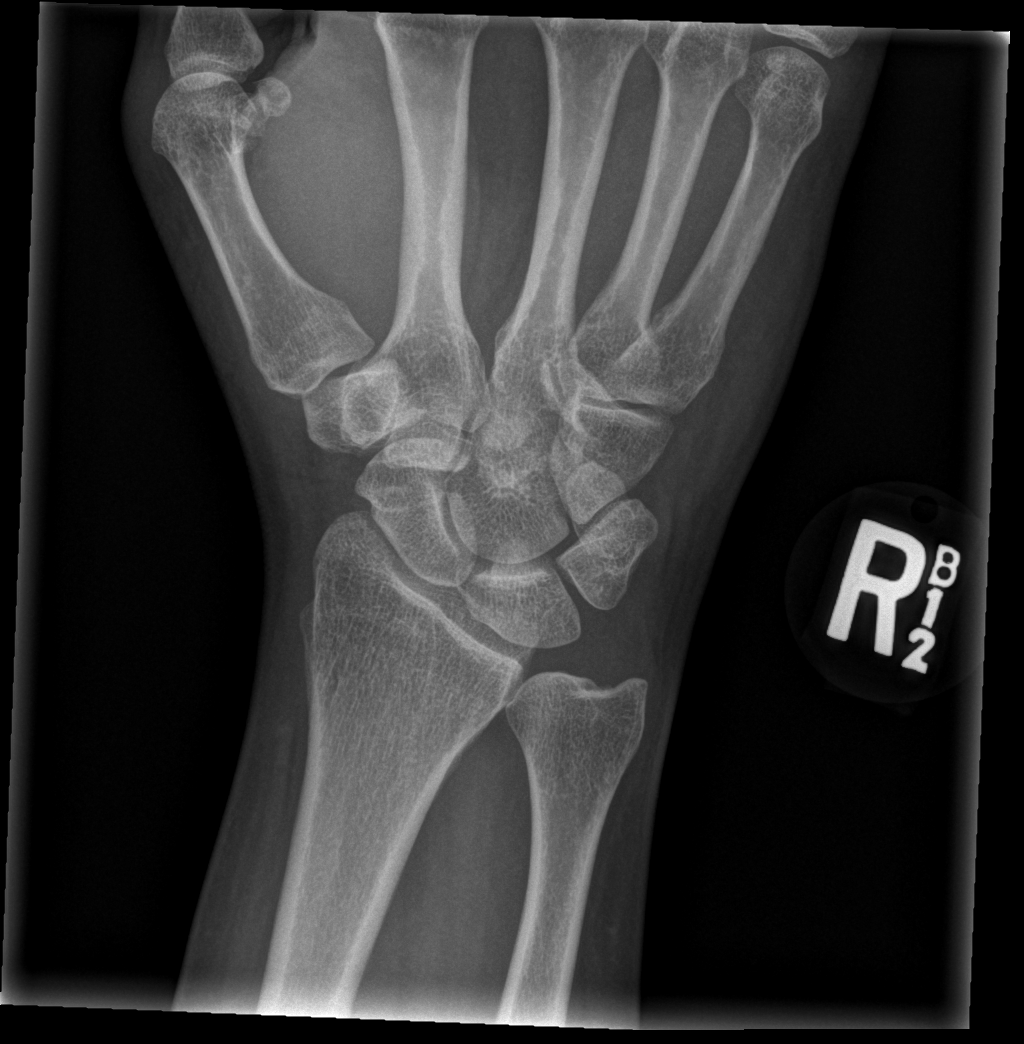

[x wrist obl right]
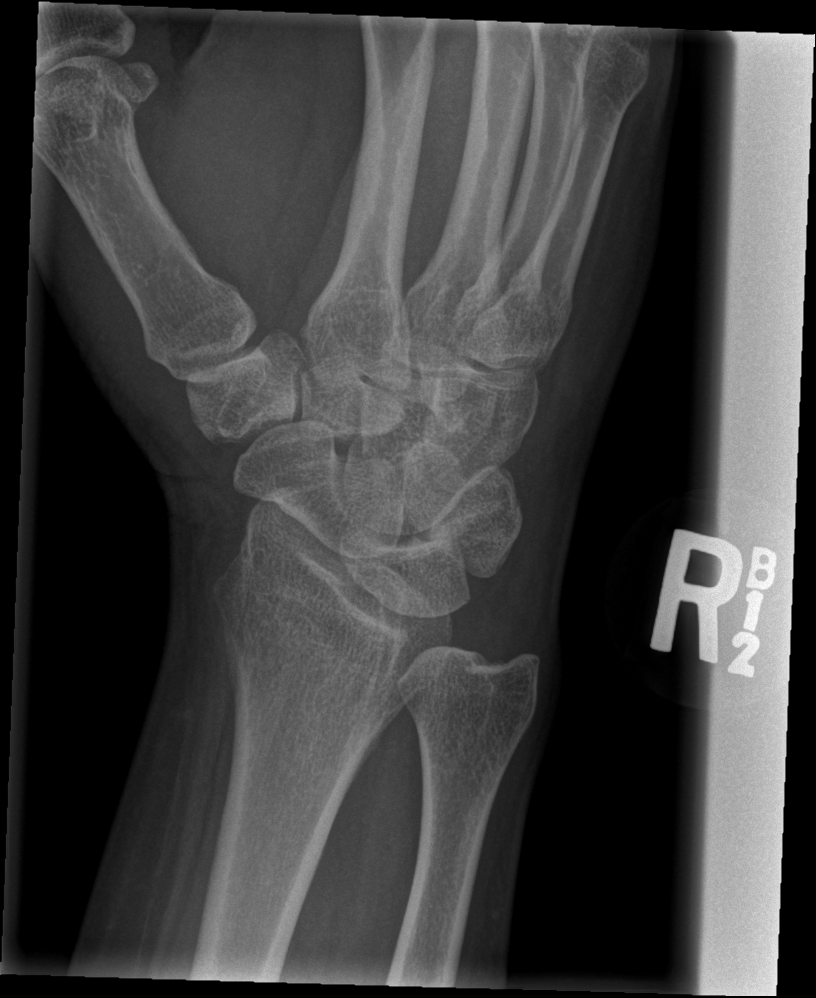

[x wrist lat right]
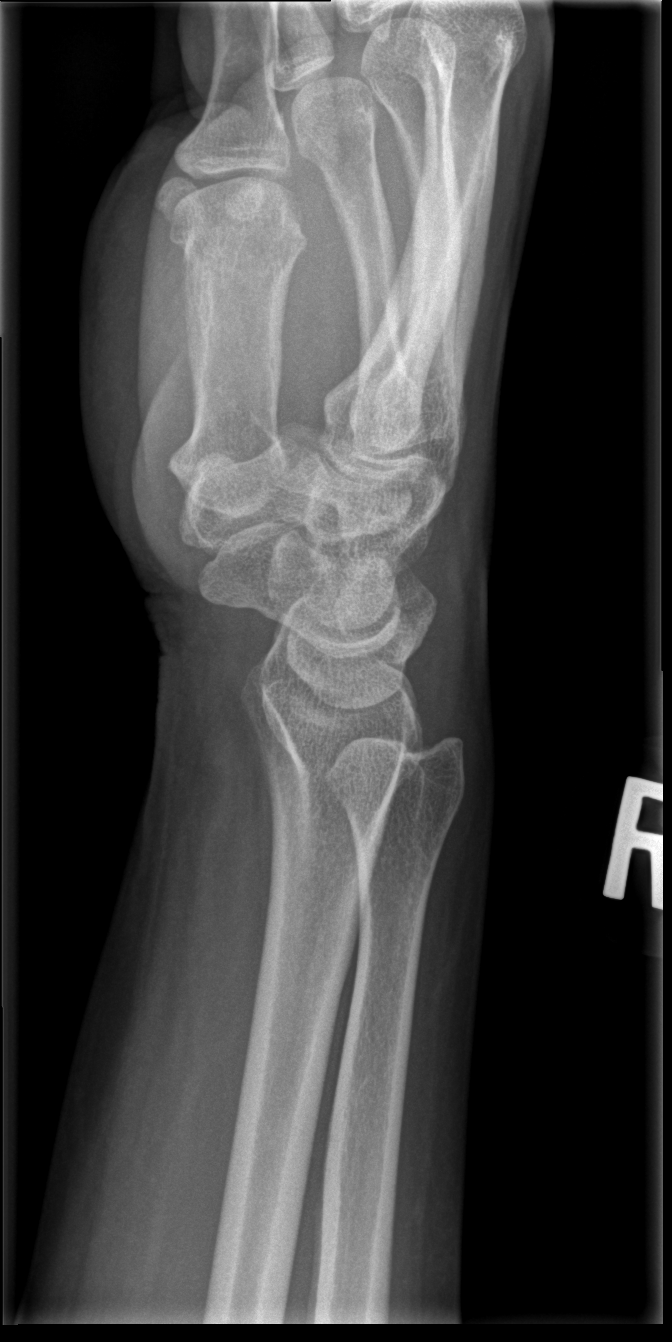

[3 of 3 positions shown; findings below may reference images not displayed]

FINDINGS: There is no evidence of fracture or dislocation. There is no
evidence of arthropathy or other focal bone abnormality. Soft
tissues are unremarkable.
IMPRESSION: Negative.
